# Patient Record
Sex: Male | Born: 1960 | Race: Black or African American | Hispanic: No | Marital: Single
Health system: Southern US, Community
[De-identification: ages and names within clinical notes are randomized; demographics above are authoritative.]

---

## 2007-06-06 ENCOUNTER — Emergency Department: Payer: Self-pay | Admitting: Emergency Medicine

## 2009-08-09 IMAGING — CR DG ELBOW COMPLETE 3+V*L*
1 series · 5 of 5 positions shown · non-contrast
Comparison: none

REASON FOR EXAM: Pt fell om (L) elbow 3 days ago
COMMENTS:

PROCEDURE:     DXR - DXR ELBOW LT COMP W/OBLIQUES  - June 06, 2007 [DATE]
RESULT:     Comparison: No available comparison exam.

[Series 1: view not recorded · 0.17mm/px · 5 of 5 slices shown]
[im 1/5]
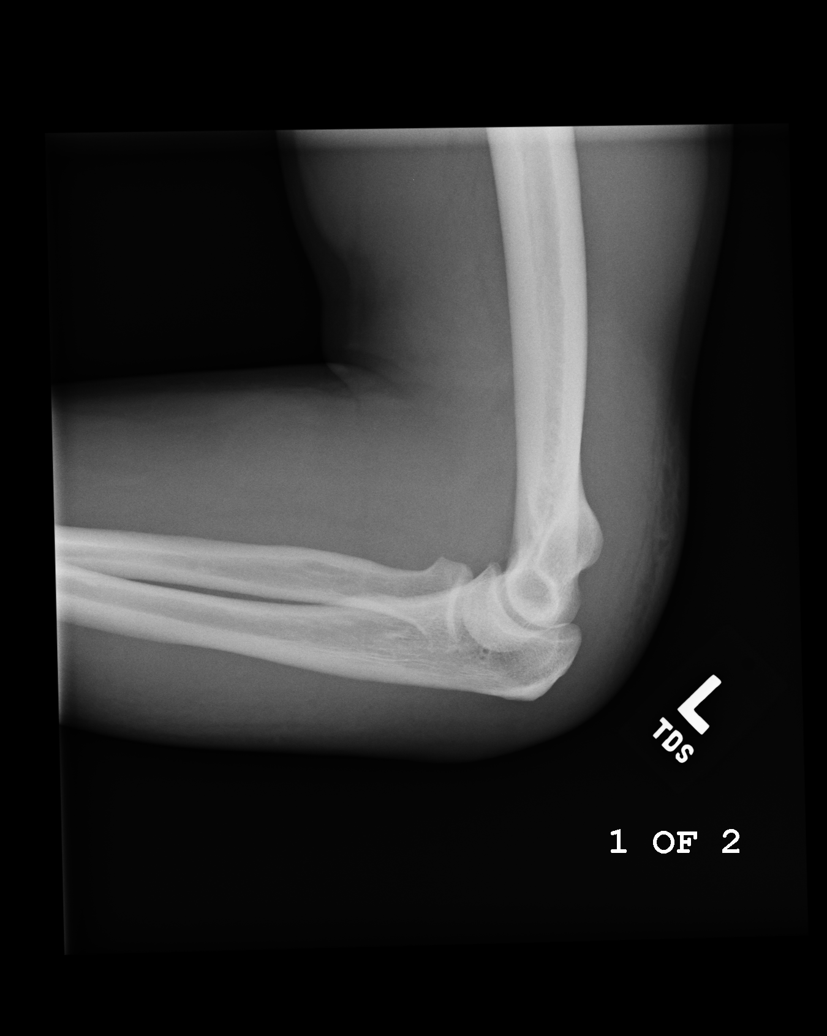
[im 2/5]
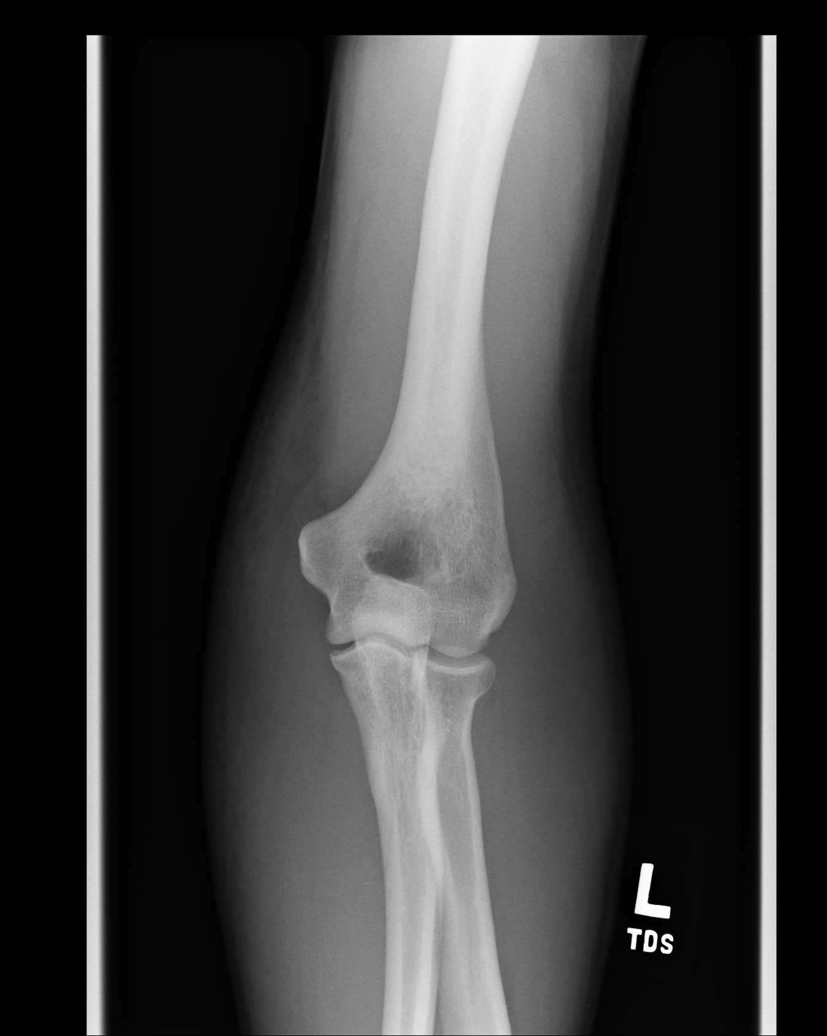
[im 3/5]
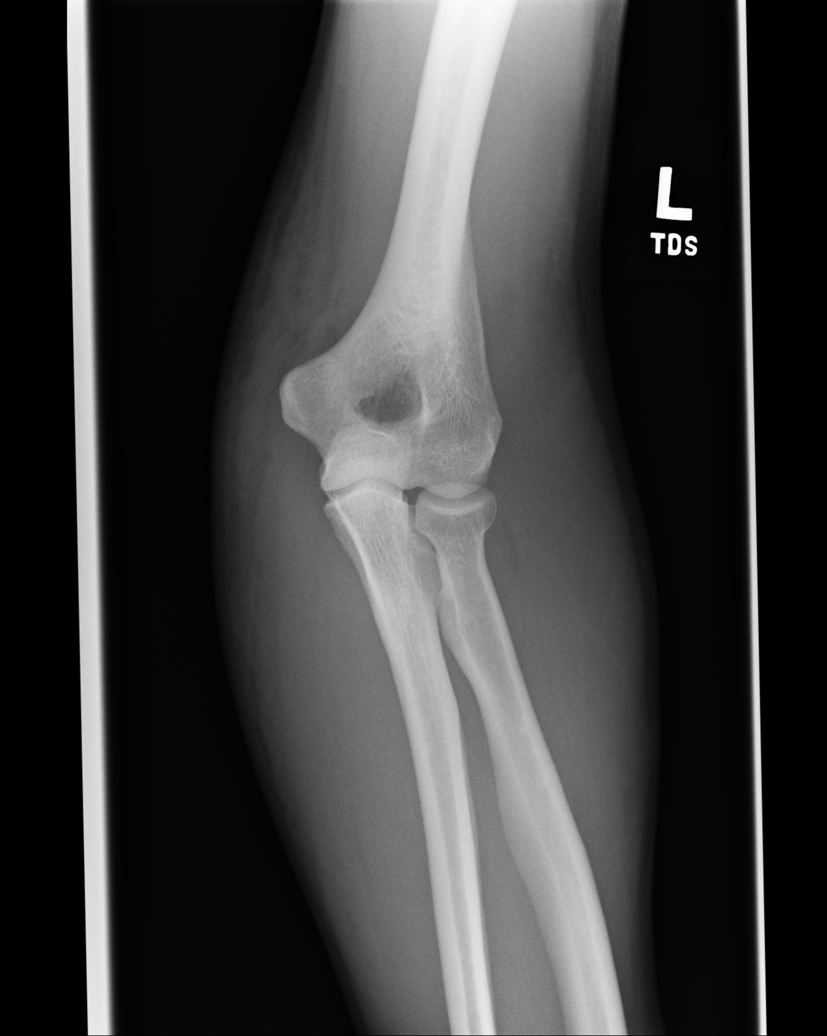
[im 4/5]
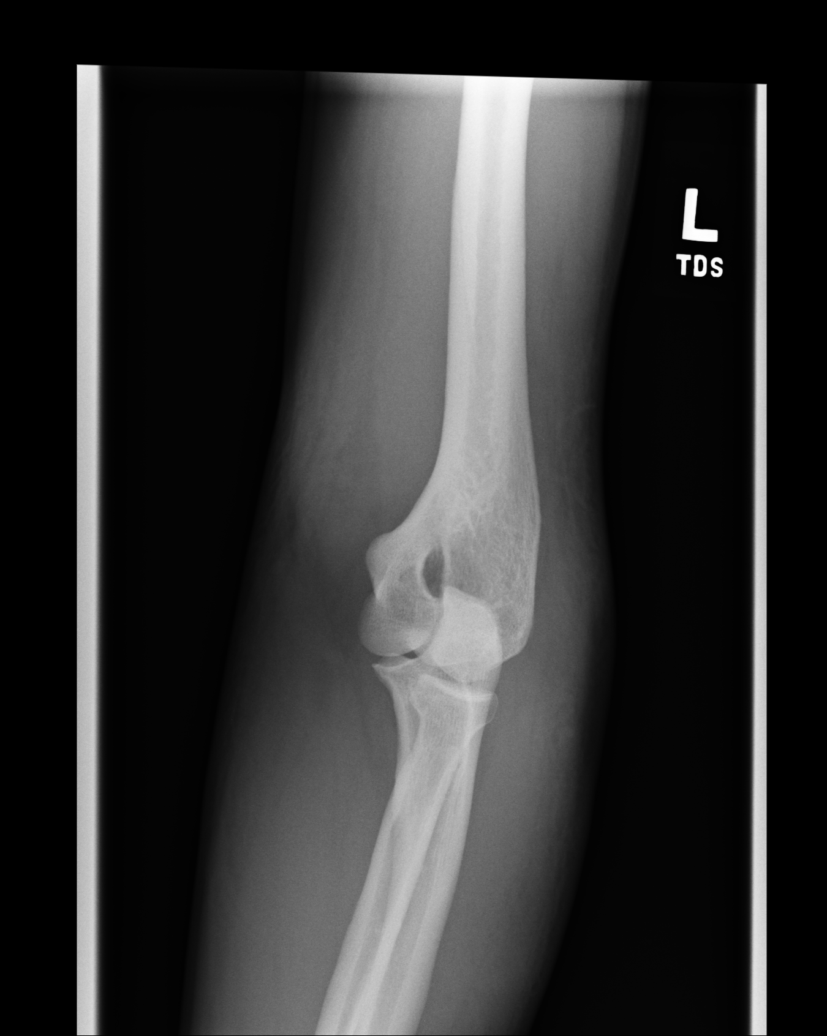
[im 5/5]
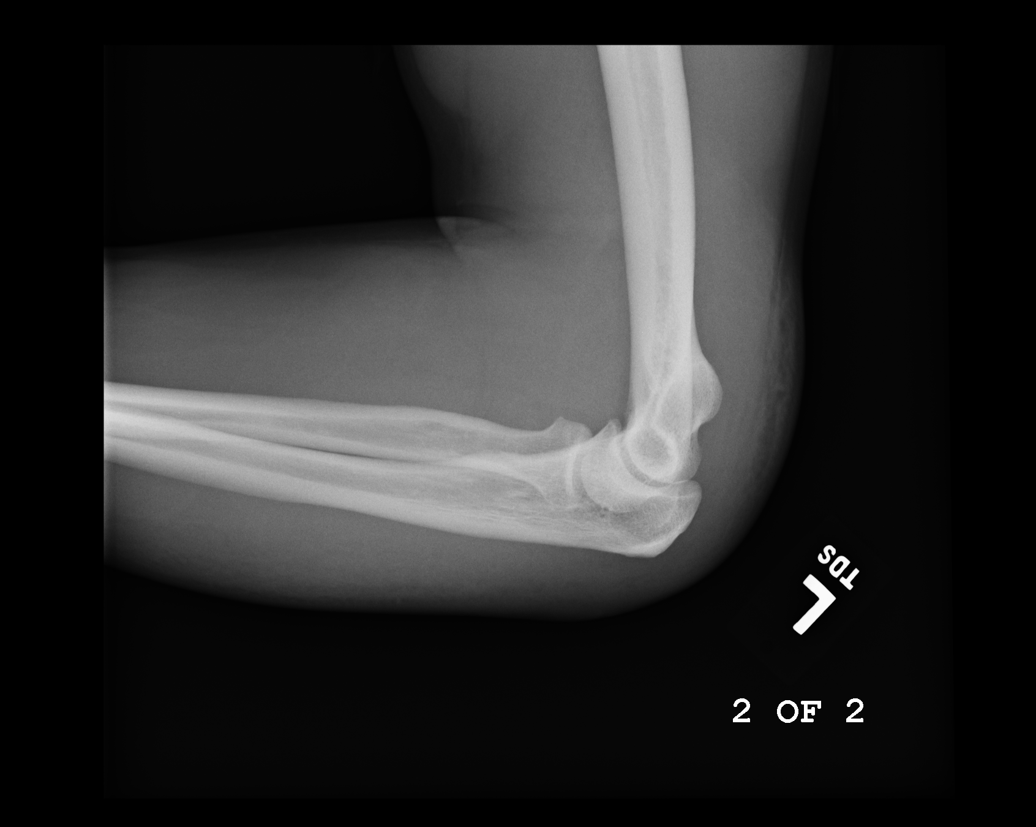

[5 of 5 positions shown; findings below may reference images not displayed]

FINDINGS: Five views of the left elbow were obtained.

There is dorsomedial left elbow soft tissue swelling. There is no
significant elbow effusion. No fracture or dislocation of the left elbow is
noted. No significant joint abnormality seen.
IMPRESSION: 1. There is dorsomedial left elbow soft tissue swelling. No fracture or
dislocation is noted.

## 2020-12-25 ENCOUNTER — Emergency Department
Admission: EM | Admit: 2020-12-25 | Discharge: 2020-12-25 | Disposition: A | Discharge status: Home / Self Care | Payer: Self-pay | Attending: Emergency Medicine | Admitting: Emergency Medicine

## 2020-12-25 ENCOUNTER — Other Ambulatory Visit: Payer: Self-pay

## 2020-12-25 DIAGNOSIS — L259 Unspecified contact dermatitis, unspecified cause: Secondary | ICD-10-CM

## 2020-12-25 DIAGNOSIS — L237 Allergic contact dermatitis due to plants, except food: Secondary | ICD-10-CM | POA: Insufficient documentation

## 2020-12-25 MED ORDER — PREDNISONE 10 MG (48) PO TBPK: ORAL_TABLET | ORAL | 0 refills | Status: AC

## 2020-12-25 MED ORDER — DEXAMETHASONE SODIUM PHOSPHATE 10 MG/ML IJ SOLN
10.0000 mg | Freq: Once | INTRAMUSCULAR | Status: AC
  Administered 2020-12-25: 10 mg via INTRAMUSCULAR
  Filled 2020-12-25: qty 1

## 2020-12-25 NOTE — ED Provider Notes (Signed)
[facility] Emergency Department Provider Note  ____________________________________________   Event Date/Time   First MD Initiated Contact with Patient 12/25/20 0801     (approximate)  I have reviewed the triage vital signs and the nursing notes.   HISTORY  Chief Complaint Rash    HPI Alec Gonzalez is a 60 y.o. male presents emergency department complaining of a poison ivy type reaction.  States he was working on a tree which had several vines on it and broke out in a rash the next day.  Symptoms have been ongoing for about 4 days.  No fever or chills.  Tried multiple over-the-counter medications without any relief.  History reviewed. No pertinent past medical history.  There are no problems to display for this patient.   History reviewed. No pertinent surgical history.  Prior to Admission medications   Medication Sig Start Date End Date Taking? Authorizing Provider  predniSONE (STERAPRED UNI-PAK 48 TAB) 10 MG (48) TBPK tablet Take 6 pills for 2 days, 5 pills x 2d, 4 pills x 2d, 3 pills x 2d, 2 pills x 2d, 1 pill x 2d 12/25/20  Yes Sae Handrich, Roselyn Bering, PA-C    Allergies Patient has no allergy information on record.  No family history on file.  Social History    Review of Systems  Constitutional: No fever/chills Eyes: No visual changes. ENT: No sore throat. Respiratory: Denies cough Cardiovascular: Denies chest pain Gastrointestinal: Denies abdominal pain Genitourinary: Negative for dysuria. Musculoskeletal: Negative for back pain. Skin: Positive for rash. Psychiatric: no mood changes,     ____________________________________________   PHYSICAL EXAM:  VITAL SIGNS: ED Triage Vitals  Enc Vitals Group     BP 12/25/20 0732 (!) 163/105     Pulse Rate 12/25/20 0732 68     Resp 12/25/20 0732 16     Temp 12/25/20 0732 97.8 F (36.6 C)     Temp Source 12/25/20 0732 Oral     SpO2 12/25/20 0732 98 %     Weight 12/25/20 0731 157 lb  (71.2 kg)     Height 12/25/20 0731 5\' 6"  (1.676 m)     Head Circumference --      Peak Flow --      Pain Score 12/25/20 0731 0     Pain Loc --      Pain Edu? --      Excl. in GC? --     Constitutional: Alert and oriented. Well appearing and in no acute distress. Eyes: Conjunctivae are normal.  Head: Atraumatic. Nose: No congestion/rhinnorhea. Mouth/Throat: Mucous membranes are moist.   Neck:  supple no lymphadenopathy noted Cardiovascular: Normal rate, regular rhythm.  Respiratory: Normal respiratory effort.  No retractions,  GU: deferred Musculoskeletal: FROM all extremities, warm and well perfused Neurologic:  Normal speech and language.  Skin:  Skin is warm, dry and intact.  We P scattered rash noted on the arms, left side of the abdomen, upper chest typical of poison ivy  psychiatric: Mood and affect are normal. Speech and behavior are normal.  ____________________________________________   LABS (all labs ordered are listed, but only abnormal results are displayed)  Labs Reviewed - No data to display ____________________________________________   ____________________________________________  RADIOLOGY    ____________________________________________   PROCEDURES  Procedure(s) performed: No  Procedures    ____________________________________________   INITIAL IMPRESSION / ASSESSMENT AND PLAN / ED COURSE  Pertinent labs & imaging results that were available during my care of the patient were reviewed by me  and considered in my medical decision making (see chart for details).   Patient is a 60 year old male presents emergency department with a poison ivy reaction.  See HPI.  Physical exam is consistent with same.  Decadron 10 mg IM given in the ED Due to the extensive amount of contact dermatitis patient was given a 14-day Sterapred pack.  He is to return emergency department worsening.  Follow-up with his regular doctor if not improving in 3 days.   Over-the-counter measures were discussed with the patient.  He was discharged stable condition.     Alec Gonzalez was evaluated in Emergency Department on 12/25/2020 for the symptoms described in the history of present illness. He was evaluated in the context of the global COVID-19 pandemic, which necessitated consideration that the patient might be at risk for infection with the SARS-CoV-2 virus that causes COVID-19. Institutional protocols and algorithms that pertain to the evaluation of patients at risk for COVID-19 are in a state of rapid change based on information released by regulatory bodies including the CDC and federal and state organizations. These policies and algorithms were followed during the patient's care in the ED.    As part of my medical decision making, I reviewed the following data within the electronic MEDICAL RECORD NUMBER Nursing notes reviewed and incorporated, Old chart reviewed, Notes from prior ED visits, and [place] Controlled Substance Database  ____________________________________________   FINAL CLINICAL IMPRESSION(S) / ED DIAGNOSES  Final diagnoses:  Acute contact dermatitis  Poison ivy      NEW MEDICATIONS STARTED DURING THIS VISIT:  New Prescriptions   PREDNISONE (STERAPRED UNI-PAK 48 TAB) 10 MG (48) TBPK TABLET    Take 6 pills for 2 days, 5 pills x 2d, 4 pills x 2d, 3 pills x 2d, 2 pills x 2d, 1 pill x 2d     Note:  This document was prepared using Dragon voice recognition software and may include unintentional dictation errors.    Faythe Ghee, PA-C 12/25/20 1106    Georga Hacking, MD 12/25/20 [phone]

## 2020-12-25 NOTE — ED Triage Notes (Signed)
Pt to ED for rash to arms and trunk for 3 days after cutting trees in yard, states unsure if poison ivy.  Red rash noted.

## 2020-12-25 NOTE — Discharge Instructions (Addendum)
Use oatmeal baths, aveeno baths, etc to help with symtpoms Take the steroid pack starting on Tuesday Return if worsening

## 2020-12-25 NOTE — ED Notes (Signed)
See triage note  Presents with possible poison ivy to arms and chest
